# Patient Record
Sex: Female | Born: 1950 | Race: Black or African American | Hispanic: No | Marital: Single | Smoking: Never smoker
Health system: Southern US, Community
[De-identification: ages and names within clinical notes are randomized; demographics above are authoritative.]

## PROBLEM LIST (undated history)

## (undated) DIAGNOSIS — E119 Type 2 diabetes mellitus without complications: Secondary | ICD-10-CM

## (undated) DIAGNOSIS — I10 Essential (primary) hypertension: Secondary | ICD-10-CM

---

## 2003-08-21 ENCOUNTER — Emergency Department (HOSPITAL_COMMUNITY): Admission: EM | Admit: 2003-08-21 | Discharge: 2003-08-21 | Payer: Self-pay

## 2004-07-01 ENCOUNTER — Ambulatory Visit: Payer: Self-pay | Admitting: Physician Assistant

## 2004-12-16 ENCOUNTER — Inpatient Hospital Stay (HOSPITAL_COMMUNITY): Admission: RE | Admit: 2004-12-16 | Discharge: 2004-12-20 | Payer: Self-pay | Admitting: Orthopedic Surgery

## 2004-12-16 ENCOUNTER — Encounter (INDEPENDENT_AMBULATORY_CARE_PROVIDER_SITE_OTHER): Payer: Self-pay | Admitting: *Deleted

## 2006-06-14 ENCOUNTER — Other Ambulatory Visit: Admission: RE | Admit: 2006-06-14 | Discharge: 2006-06-14 | Payer: Self-pay | Admitting: Obstetrics and Gynecology

## 2006-07-02 ENCOUNTER — Encounter: Admission: RE | Admit: 2006-07-02 | Discharge: 2006-07-02 | Payer: Self-pay | Admitting: Obstetrics and Gynecology

## 2008-06-19 ENCOUNTER — Encounter: Admission: RE | Admit: 2008-06-19 | Discharge: 2008-09-03 | Payer: Self-pay | Admitting: Orthopaedic Surgery

## 2011-01-23 NOTE — Consult Note (Signed)
NAMEJACKQUELYN, SUNDBERG               ACCOUNT NO.:  [id number]   MEDICAL RECORD NO.:  [id number]          PATIENT TYPE:  INP   LOCATION:  5038                         FACILITY:  [facility]   PHYSICIAN:  Hollice Espy, M.D.DATE OF BIRTH:  1951-02-06   DATE OF CONSULTATION:  12/16/2004  DATE OF DISCHARGE:                                   CONSULTATION   REQUESTING PHYSICIAN:  Nelda Severe, M.D.   PRIMARY CARE PHYSICIAN:  Dr. Ferol Luz.   REASON FOR CONSULTATION:  Diabetes and hypertension management.   HISTORY OF PRESENT ILLNESS:  Patient is a 60 year old African-American  female with a past medical history of hypertension, diabetes, and  hyperlipidemia, who was admitted on December 16, 2004 for an L4-5 fusion with  screws.  Postop, her attending physician requested an [facility]  consult for diabetes and hypertensive management.  The patient is normally  on a number of medications.  In discussion with the patient's attending,  this type of surgery almost always leads to a temporary ileus, and the  patient would not fully on p.o. for some time.  It was recommended that the  patient's medication all be held, especially in terms of her diabetic  medications.  Currently, she states that she is doing okay.  She was having  some earlier back spasms, but these have settled down.  She denies any  headaches, visual changes, dysphagia, chest pain, palpitations, shortness of  breath, wheeze, or cough.  She denies any abdominal pain.  She does complain  of some back soreness.  She denies any lower extremity weakness, numbness,  hematuria, dysuria, constipation, or diarrhea.   Her review of systems is otherwise negative.   PAST MEDICAL HISTORY:  Includes hypertension, diabetes type 2,  hyperlipidemia.   MEDICATIONS:  1.  Hydrochlorothiazide 12.5 daily.  2.  Crestor 10 p.o. daily.  3.  Metformin 500 p.o. b.i.d.  4.  Actos 30 p.o. daily.  5.  Lisinopril 10 p.o. daily.   She has a drug  sensitivity to [facility].   SOCIAL HISTORY:  The patient denies any alcohol or drug use.  She also  denies any tobacco use.   FAMILY HISTORY:  Noncontributory.   PHYSICAL EXAMINATION:  VITALS ON ADMISSION:  Temp 96.8, heart rate 58,  respirations 16, blood pressure 122/84.  GENERAL:  Patient appears to be drowsy but easily arousable.  She is alert  and oriented x 3 in no apparent distress.  HEENT:  Normocephalic and atraumatic.  Mucous membranes are slightly dry.  NECK:  She has no carotid bruits.  LUNGS:  Clear to auscultation bilaterally.  HEART:  S1 and S2.  ABDOMEN:  Soft.  Nontender.  Nondistended with minimal bowel sounds.  EXTREMITIES:  No clubbing or cyanosis.  Trace pitting edema.   LAB WORK:  Preop labs show a white count of 9.3, H&H 11.3 and 34.  Platelet  count 255.  Sodium 140, potassium 3.6, chloride 108, bicarb 28, BUN 8,  creatinine 1, glucose 108, calcium 8.9.   ASSESSMENT/PLAN:  1.  Diabetes mellitus type 2:  Given the patient's surgery and the chances  for an ileus being high, would hold all diabetes mellitus meds until she      is taking p.o.  Cover with q.6h. sensitive sliding scale.  2.  Hypertension:  Would monitor patient for increases in blood pressure      secondary to pain but hold all of her p.o. meds for now.  In regards to      her hyperlipidemia, would hold her Crestor until she is able to take      p.o. on a regular basis.  We will continue to follow this patient.      SKK/MEDQ  D:  12/16/2004  T:  12/16/2004  Job:  [id number]   cc:   Nelda Severe, MD  Fax: [phone]

## 2011-01-23 NOTE — Op Note (Signed)
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   POSTPROCEDURE DIAGNOSES:  1.  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  2.  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   PROCEDURE:  Bilateral L4 laminectomy, inferior facetectomy, posterior  interbody fusion at L4-5 with Synthes PEEK interbody spacers, autogenous  graft and BMP; bilateral posterolateral fusion with autogenous graft and  BMP; spinal instrumentation, L4-5, with Synthes Click-X screws and rods;  harvest of local autogenous graft.   SURGEON:  Nelda Severe, MD   ASSISTANT:  Lynford Citizen, R.N.   PROCEDURE NOTE:  The patient was placed under general endotracheal  anesthesia.  Bilateral sequential compression devices were placed on the  lower extremities.  A Foley catheter was placed in the bladder.  The patient  was given 1 g of Ancef intravenously.   The patient was positioned prone on a four-poster frame ([employer]).  Care was  taken in positioning the shoulders to avoid hyperflexion and abduction of  the shoulders and hyperflexion of the elbows.  The arms were padded with  foam and there was no pressure on the cubital tunnels.  The lower  extremities were gently flexed at the hips and knees and carefully padded  with pillows throughout their length.   The lumbar area was prepped with DuraPrep and draped in rectangular fashion.  The drapes were secured with Ioban.   A midline incision was made over the lower lumbar area.  Subcutaneous tissue  and paraspinal fascia were injected with a mixture of 0.25% Marcaine with  epinephrine and 1% plain lidocaine.  Dissection was carried down onto the  spinous processes.  Paraspinal muscles were reflected and mobilized  bilaterally.  A crosstable lateral radiograph was taken with a Kocher on a  spinous process that turned out to be L3.  This conformed with my impression  of last mobile segment at L5-S1.   The transverse processes of L5 and of L4 were exposed bilaterally.   Pedicle holes were made at L5 and L4 bilaterally in the following fashion:  The base of the superior articular process was removed with a Leksell  rongeur.  The posterior pedicle was identified, perforated with an awl, and  a straight pedicle probe used to make a hole through the pedicle into the  vertebral body.  Each hole was carefully palpated circumferentially to make  sure that there were no breaches through the pedicle and sounded for depth  and the depths recorded.  Radiopaque markers were placed in each hole and  sealed with bone wax.  A crosstable lateral radiograph revealed satisfactory  position of markers.   We then began to harvest local bone graft.  This  was done by reaming down  the lamina and inferior articular process at L4 with an acetabular reamer to  yield morcellized graft.  The rest of the laminectomy and inferior  facetectomy was performed using osteotomes and rongeurs.  Once dura had been  exposed, cottonoid patties were interposed between the undersurface of the  lamina and the dura.  Epidural veins were coagulated with bipolar  coagulation in both neural foramina at L4-5 and the superior articular  processes of L5 removed bilaterally, or at least 90% removed.   We then identified the L5 nerve root on either side.  The soft tissue and  epidural veins which had been coagulated were dissected off of this  laterally to the nerve root.  The nerve root was then retracted and an  annulectomy performed.  This was done first on the left side and then on the  right.  The disk  space was prepared using special disk-scraping curettes  from the Synthes interbody spacer set and up to a 13 mm scraper was used.  Further curettage was carried out with spoon curettes and degenerated  nucleus material removed with a pituitary rongeur.   When the disk space had been adequately prepared on both sides, a 13 mm  spacer was loaded with bone graft.  A piece of collagen sponge soaked in BMP  was placed anteriorly in the disk space.  Morcellized bone graft was packed  in on top of this.  Then the 13 mm spacer was introduced and positioned.   On the opposite side, more graft was packed in anteriorly and then the  spacer introduced, already loaded with bone graft.  Both implants were  impacted anteriorly to the extent that they were well-countersunk.   We also then decorticated the transverse processes of L4 and L5.  A piece of  collagen sponge soaked in BMP was placed between the transverse processes at  L4 and L5.  A morcellized graft was packed on top of this on the right side  and on the left side another piece of collagen sponge placed on top of more  morcellized graft.   The Click-X system was then assembled and the rods placed.  The construct  was compressed to maximize lordosis and to lock the interbody implants in  place.  The couplings were torqued.   Crosstable lateral radiograph showed satisfactory position of screws and  interbody spacers.   Not mentioned here is that neurological monitoring was carried out  throughout the procedure.  At all times the SSEPs were satisfactory.  Prior  to attachment of the rods, each screw was stimulated and the threshold  values were in the safe zone.   Additionally, during insertion of the screws we palpated the pedicle  medially and distally with a nerve hook to make sure that there was no  perforation by screw threads.  Some FloSeal was used to control epidural bleeding, or actually bleeding  which was coming from the disk  space on the right side.  The wound was then  closed in layers as follows:  #1 Vicryl suture in continuous fashion was  used in the fascia.  Interrupted 2-0 Vicryl sutures were used in the  subcutaneous layer over a one-eight inch Hemovac drain.  The skin was closed  using a subcuticular 3-0 undyed Vicryl and Steri-Strips.  The eighth-inch  Hemovac drain was secured with a 2-0 nylon suture.   An antibiotic ointment dressing was then attached after reinforcement of the  skin  edges with Steri-Strips and secured with OpSite.   Estimated blood loss 150 mL.  At the time of dictation, the patient is being  readied to be taken off the table, so she is asleep and no neurologic  examination is reported here.  There were no intraoperative complications.  Sponge and needle counts were correct.      MT/MEDQ  D:  12/16/2004  T:  12/16/2004  Job:  [id number]

## 2011-01-23 NOTE — Discharge Summary (Signed)
Abigail Fry, SPOHR NO.:  [id number]   MEDICAL RECORD NO.:  [id number]          PATIENT TYPE:  INP   LOCATION:  5038                         FACILITY:  [facility]   PHYSICIAN:  Nelda Severe, MD      DATE OF BIRTH:  11-23-1950   DATE OF ADMISSION:  12/16/2004  DATE OF DISCHARGE:  12/20/2004                                 DISCHARGE SUMMARY   This woman was admitted for management of lumbar pain secondary to L4-5 disk  disease.  On the day of admission, she was taken to the operating room where  posterior interbody and posterolateral fusion was carried out at L4-5.  Postoperatively, her care was comanaged with the [facility].  She is a type 2 diabetic.  She spiked a fever to approximately 103 degrees  Fahrenheit, but no serious etiology was ever defined.  Her ileus resolved  around the third postoperative day.  Her incision is healing well with no  drainage and no erythema.  She is ambulatory with a walker.  She is  afebrile.  Her pain is controlled on oral Percocet.   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   CONDITION ON DISCHARGE:  Improved back pain.  Ambulatory with walker.  Would  stable.   DISCHARGE MEDICATIONS:  1.  Percocet.  A prescription dated today, December 20, 2004, for 75 tablets,      one to two q.6h. p.r.n. and a second prescription if necessary dated      December 30, 2004, for 50 Percocet tablets one q.6h. p.r.n.  2.  She was also give a prescription for Colace 100 mg b.i.d. for three      weeks to be repeated once if necessary.   FOLLOWUP:  She is to follow up in the office in four weeks' time.   ACTIVITY:  She is to avoid bending and lifting.   SPECIAL INSTRUCTIONS:  She is to call me if there is any wound drainage or  other problem.      MT/MEDQ  D:  12/20/2004  T:  12/20/2004  Job:  [id number]

## 2014-07-25 ENCOUNTER — Ambulatory Visit: Payer: 59 | Attending: Physical Therapy | Admitting: Physical Therapy

## 2014-07-25 DIAGNOSIS — I10 Essential (primary) hypertension: Secondary | ICD-10-CM | POA: Insufficient documentation

## 2014-07-25 DIAGNOSIS — E119 Type 2 diabetes mellitus without complications: Secondary | ICD-10-CM | POA: Insufficient documentation

## 2014-07-25 DIAGNOSIS — Z981 Arthrodesis status: Secondary | ICD-10-CM | POA: Insufficient documentation

## 2014-07-25 DIAGNOSIS — M1990 Unspecified osteoarthritis, unspecified site: Secondary | ICD-10-CM | POA: Insufficient documentation

## 2014-07-25 DIAGNOSIS — M25559 Pain in unspecified hip: Secondary | ICD-10-CM | POA: Insufficient documentation

## 2014-07-25 DIAGNOSIS — M79673 Pain in unspecified foot: Secondary | ICD-10-CM | POA: Insufficient documentation

## 2014-07-25 DIAGNOSIS — M25562 Pain in left knee: Secondary | ICD-10-CM | POA: Insufficient documentation

## 2014-07-25 DIAGNOSIS — M25561 Pain in right knee: Secondary | ICD-10-CM | POA: Insufficient documentation

## 2014-07-25 DIAGNOSIS — Z5189 Encounter for other specified aftercare: Secondary | ICD-10-CM | POA: Insufficient documentation

## 2014-07-30 ENCOUNTER — Ambulatory Visit: Payer: 59

## 2014-07-30 DIAGNOSIS — Z5189 Encounter for other specified aftercare: Secondary | ICD-10-CM | POA: Diagnosis not present

## 2014-08-16 ENCOUNTER — Ambulatory Visit: Payer: 59 | Admitting: Physical Therapy

## 2014-08-30 ENCOUNTER — Ambulatory Visit: Payer: 59 | Admitting: Physical Therapy

## 2014-08-30 ENCOUNTER — Ambulatory Visit: Payer: 59 | Attending: Internal Medicine | Admitting: Physical Therapy

## 2014-08-30 DIAGNOSIS — Z981 Arthrodesis status: Secondary | ICD-10-CM | POA: Insufficient documentation

## 2014-08-30 DIAGNOSIS — M25559 Pain in unspecified hip: Secondary | ICD-10-CM | POA: Diagnosis not present

## 2014-08-30 DIAGNOSIS — M79673 Pain in unspecified foot: Secondary | ICD-10-CM | POA: Insufficient documentation

## 2014-08-30 DIAGNOSIS — M1990 Unspecified osteoarthritis, unspecified site: Secondary | ICD-10-CM | POA: Insufficient documentation

## 2014-08-30 DIAGNOSIS — E119 Type 2 diabetes mellitus without complications: Secondary | ICD-10-CM | POA: Diagnosis not present

## 2014-08-30 DIAGNOSIS — Z5189 Encounter for other specified aftercare: Secondary | ICD-10-CM | POA: Insufficient documentation

## 2014-08-30 DIAGNOSIS — I10 Essential (primary) hypertension: Secondary | ICD-10-CM | POA: Insufficient documentation

## 2014-08-30 DIAGNOSIS — M25561 Pain in right knee: Secondary | ICD-10-CM | POA: Diagnosis not present

## 2014-08-30 DIAGNOSIS — M25562 Pain in left knee: Secondary | ICD-10-CM | POA: Diagnosis not present

## 2017-05-13 ENCOUNTER — Emergency Department (HOSPITAL_COMMUNITY): Payer: 59

## 2017-05-13 ENCOUNTER — Encounter (HOSPITAL_COMMUNITY): Payer: Self-pay | Admitting: *Deleted

## 2017-05-13 ENCOUNTER — Emergency Department (HOSPITAL_COMMUNITY)
Admission: EM | Admit: 2017-05-13 | Discharge: 2017-05-13 | Disposition: A | Discharge status: Home / Self Care | Payer: 59 | Attending: Emergency Medicine | Admitting: Emergency Medicine

## 2017-05-13 DIAGNOSIS — E119 Type 2 diabetes mellitus without complications: Secondary | ICD-10-CM | POA: Insufficient documentation

## 2017-05-13 DIAGNOSIS — N200 Calculus of kidney: Secondary | ICD-10-CM

## 2017-05-13 DIAGNOSIS — Z794 Long term (current) use of insulin: Secondary | ICD-10-CM | POA: Diagnosis not present

## 2017-05-13 DIAGNOSIS — R1031 Right lower quadrant pain: Secondary | ICD-10-CM | POA: Diagnosis present

## 2017-05-13 DIAGNOSIS — N23 Unspecified renal colic: Secondary | ICD-10-CM

## 2017-05-13 DIAGNOSIS — R109 Unspecified abdominal pain: Secondary | ICD-10-CM

## 2017-05-13 DIAGNOSIS — Z79899 Other long term (current) drug therapy: Secondary | ICD-10-CM | POA: Insufficient documentation

## 2017-05-13 DIAGNOSIS — I10 Essential (primary) hypertension: Secondary | ICD-10-CM | POA: Insufficient documentation

## 2017-05-13 HISTORY — DX: Type 2 diabetes mellitus without complications: E11.9

## 2017-05-13 HISTORY — DX: Essential (primary) hypertension: I10

## 2017-05-13 LAB — URINALYSIS, ROUTINE W REFLEX MICROSCOPIC
Bilirubin Urine: NEGATIVE
Glucose, UA: NEGATIVE mg/dL
Ketones, ur: NEGATIVE mg/dL
Leukocytes, UA: NEGATIVE
Nitrite: NEGATIVE
Protein, ur: NEGATIVE mg/dL
Specific Gravity, Urine: 1.019 (ref 1.005–1.030)
pH: 5 (ref 5.0–8.0)

## 2017-05-13 LAB — COMPREHENSIVE METABOLIC PANEL
ALT: 55 U/L — ABNORMAL HIGH (ref 14–54)
AST: 55 U/L — ABNORMAL HIGH (ref 15–41)
Albumin: 4.1 g/dL (ref 3.5–5.0)
Alkaline Phosphatase: 79 U/L (ref 38–126)
Anion gap: 9 (ref 5–15)
BUN: 14 mg/dL (ref 6–20)
CO2: 24 mmol/L (ref 22–32)
Calcium: 9.6 mg/dL (ref 8.9–10.3)
Chloride: 104 mmol/L (ref 101–111)
Creatinine, Ser: 0.95 mg/dL (ref 0.44–1.00)
GFR calc Af Amer: >60 mL/min (ref >60)
GFR calc non Af Amer: >60 mL/min (ref >60)
Glucose, Bld: 124 mg/dL — ABNORMAL HIGH (ref 65–99)
Potassium: 4.7 mmol/L (ref 3.5–5.1)
Sodium: 137 mmol/L (ref 135–145)
Total Bilirubin: 0.8 mg/dL (ref 0.3–1.2)
Total Protein: 7.3 g/dL (ref 6.5–8.1)

## 2017-05-13 LAB — CBC
HCT: 40.4 % (ref 36.0–46.0)
Hemoglobin: 13.2 g/dL (ref 12.0–15.0)
MCH: 28.1 pg (ref 26.0–34.0)
MCHC: 32.7 g/dL (ref 30.0–36.0)
MCV: 86.1 fL (ref 78.0–100.0)
Platelets: 248 10*3/uL (ref 150–400)
RBC: 4.69 MIL/uL (ref 3.87–5.11)
RDW: 14.4 % (ref 11.5–15.5)
WBC: 9.9 10*3/uL (ref 4.0–10.5)

## 2017-05-13 LAB — LIPASE, BLOOD: Lipase: 31 U/L (ref 11–51)

## 2017-05-13 MED ORDER — HYDROCODONE-ACETAMINOPHEN 5-325 MG PO TABS: 1.0000 | ORAL_TABLET | Freq: Four times a day (QID) | ORAL | 0 refills | Status: DC | PRN

## 2017-05-13 MED ORDER — HYDROCODONE-ACETAMINOPHEN 5-325 MG PO TABS: 1.0000 | ORAL_TABLET | Freq: Four times a day (QID) | ORAL | 0 refills | Status: AC | PRN

## 2017-05-13 MED ORDER — KETOROLAC TROMETHAMINE 15 MG/ML IJ SOLN
15.0000 mg | Freq: Once | INTRAMUSCULAR | Status: AC
  Administered 2017-05-13: 15 mg via INTRAVENOUS
  Filled 2017-05-13: qty 1

## 2017-05-13 MED ORDER — LACTATED RINGERS IV BOLUS (SEPSIS)
1000.0000 mL | Freq: Once | INTRAVENOUS | Status: AC
Start: 2017-05-13 — End: 2017-05-13
  Administered 2017-05-13: 1000 mL via INTRAVENOUS

## 2017-05-13 MED ORDER — HYDROCODONE-ACETAMINOPHEN 5-325 MG PO TABS
1.0000 | ORAL_TABLET | Freq: Once | ORAL | Status: AC
  Administered 2017-05-13: 1 via ORAL
  Filled 2017-05-13: qty 1

## 2017-05-13 MED ORDER — ONDANSETRON HCL 4 MG/2ML IJ SOLN
4.0000 mg | Freq: Once | INTRAMUSCULAR | Status: AC
  Administered 2017-05-13: 4 mg via INTRAVENOUS
  Filled 2017-05-13: qty 2

## 2017-05-13 NOTE — ED Notes (Signed)
Pt stable, ambulatory, states understanding of discharge instructions 

## 2017-05-13 NOTE — ED Notes (Signed)
Family at bedside. PT actively at this time

## 2017-05-13 NOTE — ED Triage Notes (Signed)
The pt arrived by gems from home.  C/o severe pain rt flank with pressure for 45 minutes peior to arrival  Nausea no vomiting yet  cbg 84  No iv one attecmpt  unsucessful by ems  No bloody urine no urinary symptoms pain with movement

## 2017-05-13 NOTE — ED Provider Notes (Signed)
[facility]EMERGENCY DEPT Provider Note   CSN: [id number] Arrival date & time: 05/13/17  1951  History   Chief Complaint Chief Complaint  Patient presents with  . Flank Pain    HPI Abigail Fry is a 66 y.o. female.  This is a 66 year old female with PMH of HTN, T2 DM who presents with acute onset of right flank pain that radiates to her groin approximately 3 hours prior when picking up her child from daycare.  Patient states she has intense nausea but no vomiting, dysuria, hesitancy, feelings of straining when she urinates.  She denies any dyspnea, chest pain, change in bowel movements.  She denies any numbness or tingling in her extremities.  Denies any personal or family history of kidney stones.   The history is provided by the patient.    Past Medical History:  Diagnosis Date  . Diabetes mellitus without complication (HCC)   . Hypertension     There are no active problems to display for this patient.   No past surgical history on file.  OB History    No data available       Home Medications    Prior to Admission medications   Medication Sig Start Date End Date Taking? Authorizing Provider  atorvastatin (LIPITOR) 20 MG tablet Take 20 mg by mouth daily. 04/08/17  Yes [provider]  cetirizine (ZYRTEC) 10 MG tablet Take 10 mg by mouth daily. 07/03/13  Yes [provider]  fluticasone (FLONASE) 50 MCG/ACT nasal spray Place 1 spray into both nostrils as needed for allergies. 02/06/15  Yes [provider]  gabapentin (NEURONTIN) 300 MG capsule Take 300 mg by mouth 2 (two) times daily. 10/08/15  Yes [provider]  glimepiride (AMARYL) 1 MG tablet Take 1 mg by mouth daily. 03/15/17  Yes [provider]  Insulin Glargine (BASAGLAR KWIKPEN) 100 UNIT/ML SOPN Inject 20 Units into the skin at bedtime. 05/12/17  Yes [provider]  losartan-hydrochlorothiazide (HYZAAR) 50-12.5 MG tablet Take 1 tablet by mouth daily. 04/14/17  Yes  [provider]  metFORMIN (GLUCOPHAGE) 1000 MG tablet Take 500 mg by mouth 2 (two) times daily. 04/14/16  Yes [provider]  potassium chloride SA (K-DUR,KLOR-CON) 20 MEQ tablet Take 20 mEq by mouth 2 (two) times daily. 09/07/16 09/07/17 Yes [provider]  HYDROcodone-acetaminophen (NORCO/VICODIN) 5-325 MG tablet Take 1 tablet by mouth every 6 (six) hours as needed for moderate pain. 05/13/17 05/14/17  Shaune PollackBriggs, Carmeline Kowal, MD    Family History No family history on file.  Social History Social History  Substance Use Topics  . Smoking status: Never Smoker  . Smokeless tobacco: Never Used  . Alcohol use No     Allergies   Ace inhibitors   Review of Systems Review of Systems  Constitutional: Negative for chills and fever.  HENT: Negative for ear pain and sore throat.   Eyes: Negative for pain and visual disturbance.  Respiratory: Negative for cough and shortness of breath.   Cardiovascular: Negative for chest pain and palpitations.  Gastrointestinal: Negative for abdominal pain and vomiting.  Genitourinary: Negative for dysuria and hematuria.  Musculoskeletal: Negative for arthralgias and back pain.  Skin: Negative for color change and rash.  Neurological: Negative for seizures and syncope.  All other systems reviewed and are negative.    Physical Exam Updated Vital Signs BP 126/60   Pulse (!) 55   Temp 97.6 F (36.4 C) (Oral)   Resp 18   Ht 5' 6.5" (1.689 m)  Wt 95.3 kg (210 lb)   SpO2 100%   BMI 33.39 kg/m   Physical Exam  Constitutional: She appears well-developed and well-nourished. No distress.  HENT:  Head: Normocephalic and atraumatic.  Eyes: Conjunctivae are normal.  Neck: Neck supple.  Cardiovascular: Normal rate and regular rhythm.   No murmur heard. Pulmonary/Chest: Effort normal and breath sounds normal. No respiratory distress.  Abdominal: Soft. There is tenderness in the suprapubic area. There is CVA tenderness. There is no  rigidity and no guarding.  Right sided CVA tenderness.  Musculoskeletal: She exhibits no edema.  Neurological: She is alert.  Skin: Skin is warm and dry.  Psychiatric: She has a normal mood and affect.  Nursing note and vitals reviewed.    ED Treatments / Results  Labs (all labs ordered are listed, but only abnormal results are displayed) Labs Reviewed  COMPREHENSIVE METABOLIC PANEL - Abnormal; Notable for the following:       Result Value   Glucose, Bld 124 (*)    AST 55 (*)    ALT 55 (*)    All other components within normal limits  URINALYSIS, ROUTINE W REFLEX MICROSCOPIC - Abnormal; Notable for the following:    APPearance HAZY (*)    Hgb urine dipstick SMALL (*)    Bacteria, UA RARE (*)    Squamous Epithelial / LPF 0-5 (*)    All other components within normal limits  LIPASE, BLOOD  CBC    EKG  EKG Interpretation None       Radiology Ct Abdomen Pelvis Wo Contrast  Result Date: 05/13/2017 CLINICAL DATA:  Severe right flank pain. EXAM: CT ABDOMEN AND PELVIS WITHOUT CONTRAST TECHNIQUE: Multidetector CT imaging of the abdomen and pelvis was performed following the standard protocol without IV contrast. COMPARISON:  None. FINDINGS: Lower chest: No acute abnormality. Hepatobiliary: No focal liver abnormality is seen. No gallstones, gallbladder wall thickening, or biliary dilatation. Pancreas: Unremarkable. No pancreatic ductal dilatation or surrounding inflammatory changes. Spleen: Normal in size without focal abnormality. Adrenals/Urinary Tract: Right-sided hydronephrosis and perinephric stranding is identified. There is also right ureterectasis extending into the pelvis. There is a 4 mm stone in the right side of the bladder. No ureteral stones noted. The kidneys, ureters, and bladder are otherwise normal. The adrenal glands are normal. Stomach/Bowel: There is a small hiatal hernia. The stomach and small bowel are normal. The colon is normal. The appendix is not visualized but  there is no secondary evidence of appendicitis. Vascular/Lymphatic: No significant vascular findings are present. No enlarged abdominal or pelvic lymph nodes. Reproductive: Status post hysterectomy. No adnexal masses. Other: No free air or free fluid. Musculoskeletal: Postsurgical changes in the spine. IMPRESSION: 1. There is a 4 mm stone in the right side of the bladder consistent with a recently passed stone. Mild right hydronephrosis, perinephric stranding, and moderate right ureterectasis persist, consistent with the recently passed stone. Electronically Signed   By: Gerome Sam III M.D   On: 05/13/2017 21:30    Procedures Procedures (including critical care time)  Medications Ordered in ED Medications  ketorolac (TORADOL) 15 MG/ML injection 15 mg (15 mg Intravenous Given 05/13/17 2040)  ondansetron (ZOFRAN) injection 4 mg (4 mg Intravenous Given 05/13/17 2039)  lactated ringers bolus 1,000 mL (0 mLs Intravenous Stopped 05/13/17 2211)  HYDROcodone-acetaminophen (NORCO/VICODIN) 5-325 MG per tablet 1 tablet (1 tablet Oral Given 05/13/17 2211)     Initial Impression / Assessment and Plan / ED Course  I have reviewed the triage vital signs  and the nursing notes.  Pertinent labs & imaging results that were available during my care of the patient were reviewed by me and considered in my medical decision making (see chart for details).     This is a 66 year old female with PMH of HTN, T2 DM who presents with acute onset of right flank pain that radiates to her groin approximately 3 hours prior when picking up her child from daycare.   Patient has CVA tenderness on exam radiating to the suprapubic area on her right side.  Patient Has classic presentation for colicky abdominal pain consistent with concern for nephrolithiasis.  Basic blood work ordered including CBC, BMP, urine studies. Urine studies show no sign of acute infection. CT abdomen pelvis without contrast ordered for stone  study.  Patient given Zofran, Toradol, IV fluids. Patient subsequently given Norco for pain control and tolerated PO intake.  CT scan demonstrates bladder stone with mild right hydronephrosis.  Stone measures 4 mm.  At this time patient cleared for outpatient follow-up with pain control and instructions on urine straining.  Return precautions given. All questions answered. Patient given Rx for Norco for 2 days. [place] controlled substance database was used prior to prescription writing.  Final Clinical Impressions(s) / ED Diagnoses   Final diagnoses:  Right flank pain  Nephrolithiasis  Ureteral colic    New Prescriptions New Prescriptions   HYDROCODONE-ACETAMINOPHEN (NORCO/VICODIN) 5-325 MG TABLET    Take 1 tablet by mouth every 6 (six) hours as needed for moderate pain.     Shaune Pollack, MD 05/13/17 2300    Nira Conn, MD 05/14/17 [phone]

## 2017-05-13 NOTE — ED Notes (Signed)
MD Eliot FordBriggs gave pt prescription and work note left out of original discharge papers.

## 2017-05-13 NOTE — ED Notes (Signed)
Pt ambulated to restroom with standby assistance. Steady gait noted and no additional assistance needed.

## 2019-01-01 IMAGING — CT CT ABD-PELV W/O CM
2 of 4 series · 16 of 46 positions shown, 18 images · non-contrast
Comparison: None.

CLINICAL DATA: Severe right flank pain.

EXAM:
CT ABDOMEN AND PELVIS WITHOUT CONTRAST
TECHNIQUE: Multidetector CT imaging of the abdomen and pelvis was performed
following the standard protocol without IV contrast.

[Series 3: ap without · axial · non-contrast · 0.89mm/px · z∈[+761,+1186]mm · 13 of 96 slices shown, 15 images]
[im 6/96  soft-tissue]
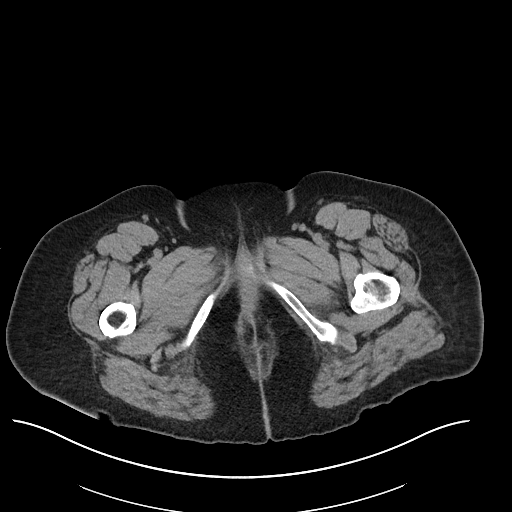
[im 6/96  bone]
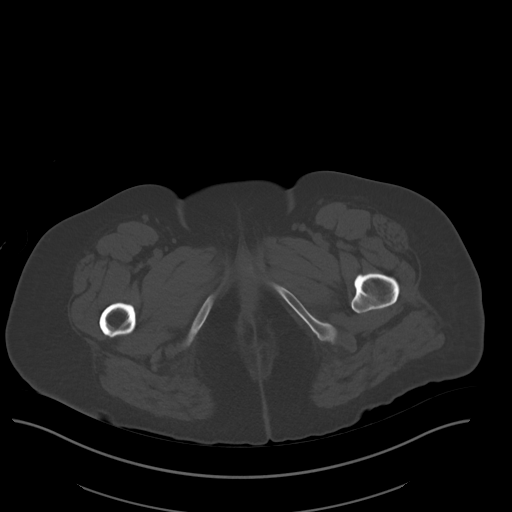
[im 16/96  soft-tissue]
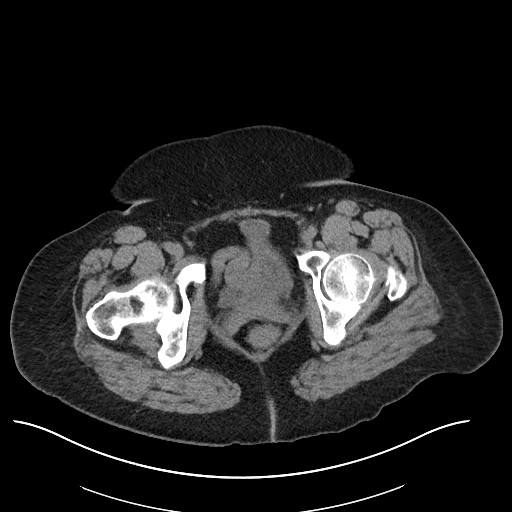
[im 21/96  soft-tissue]
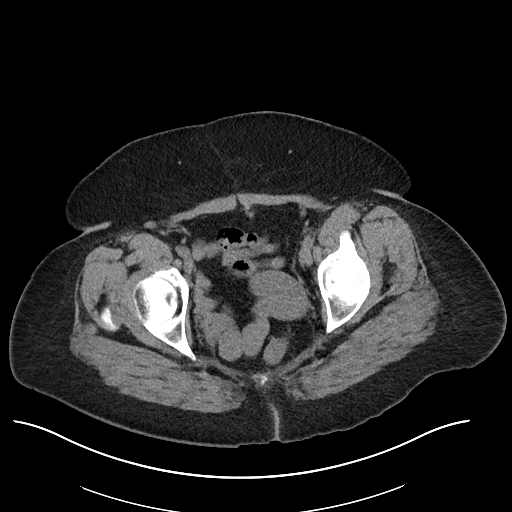
[im 26/96  soft-tissue]
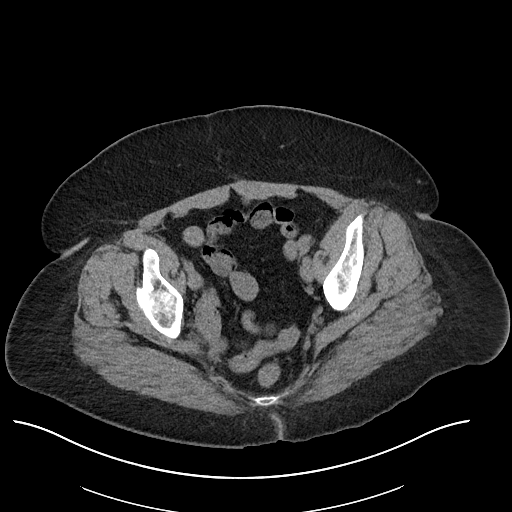
[im 36/96  soft-tissue]
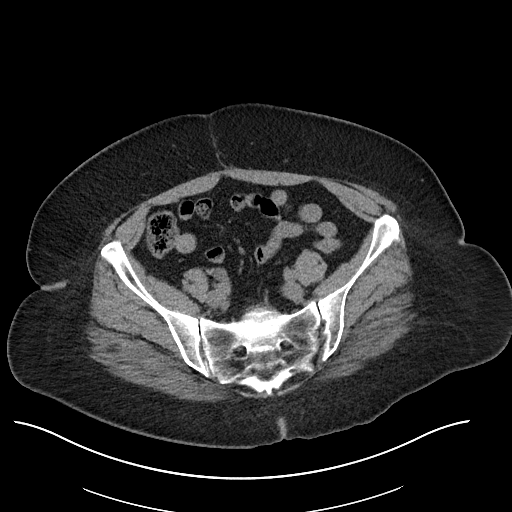
[im 41/96  soft-tissue]
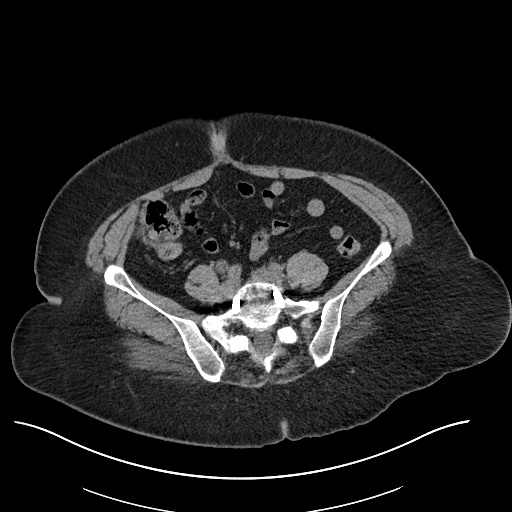
[im 51/96  soft-tissue]
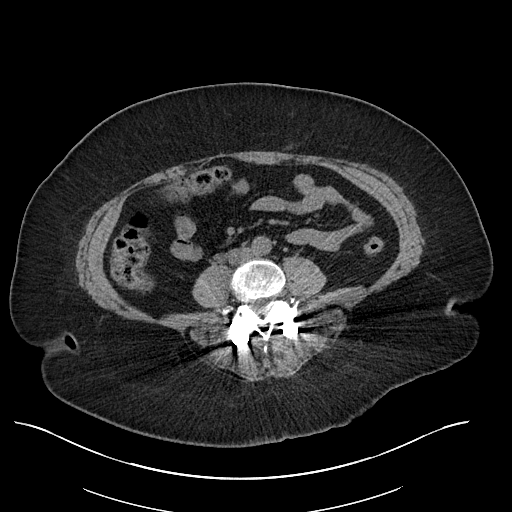
[im 56/96  soft-tissue]
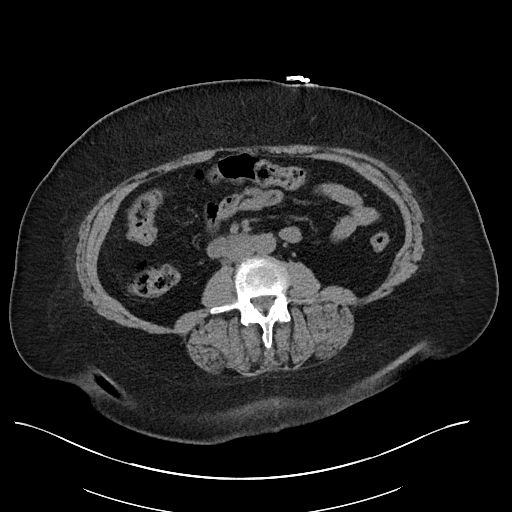
[im 61/96  soft-tissue]
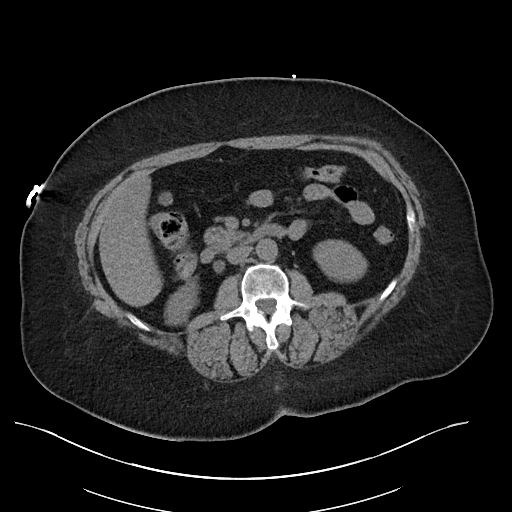
[im 61/96  bone]
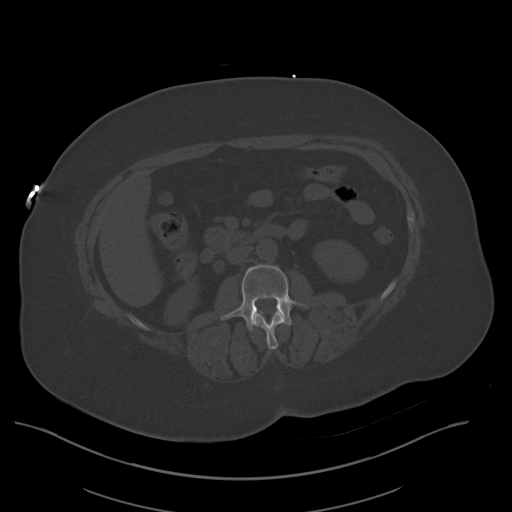
[im 71/96  soft-tissue]
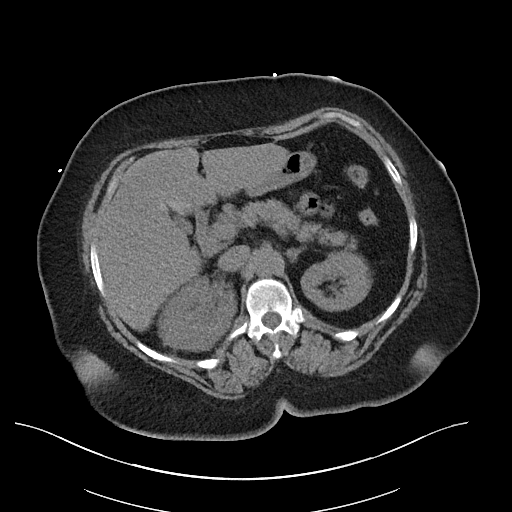
[im 76/96  soft-tissue]
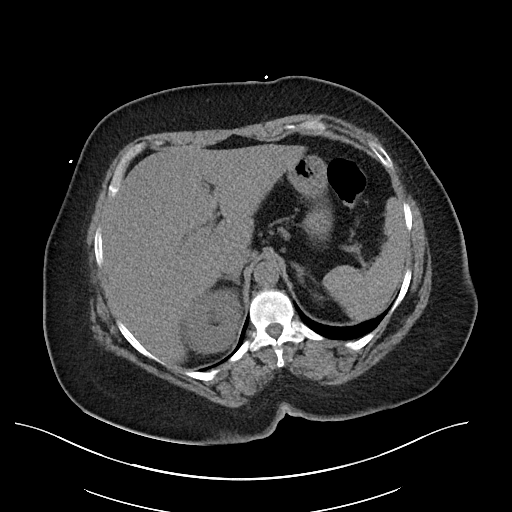
[im 81/96  soft-tissue]
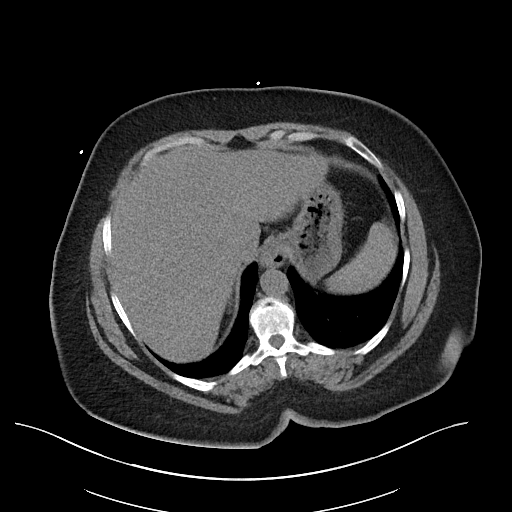
[im 91/96  soft-tissue]
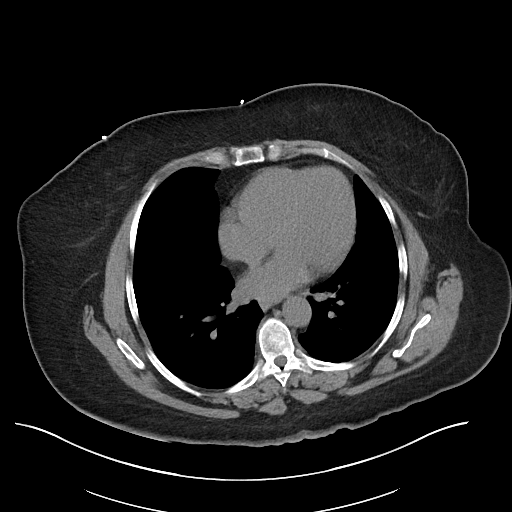

[Series 6: cor · coronal · 0.80mm/px · 3 of 90 slices shown]
[im 30/90  soft-tissue]
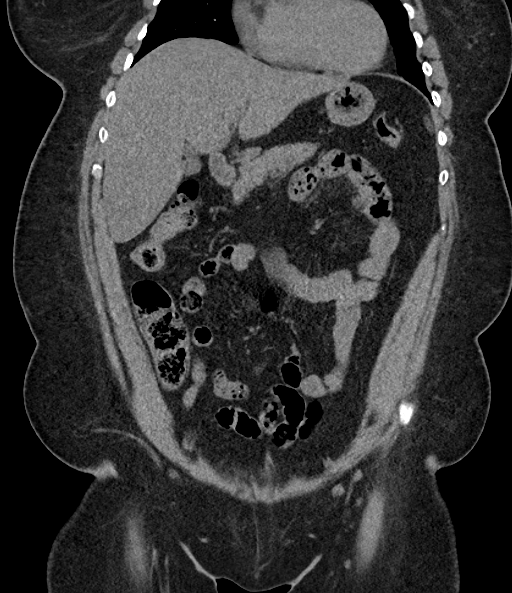
[im 40/90  soft-tissue]
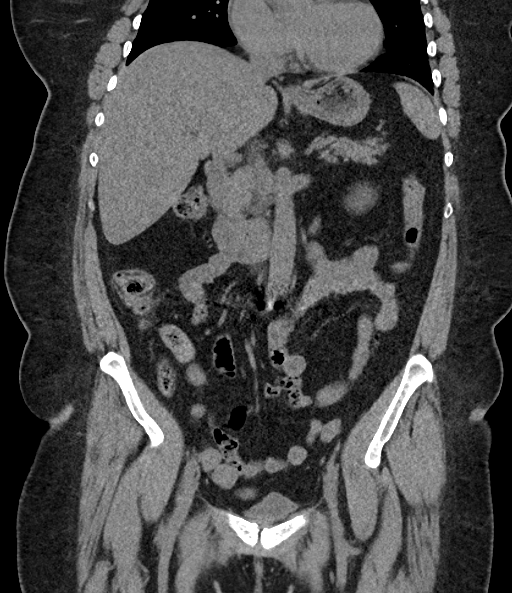
[im 50/90  soft-tissue]
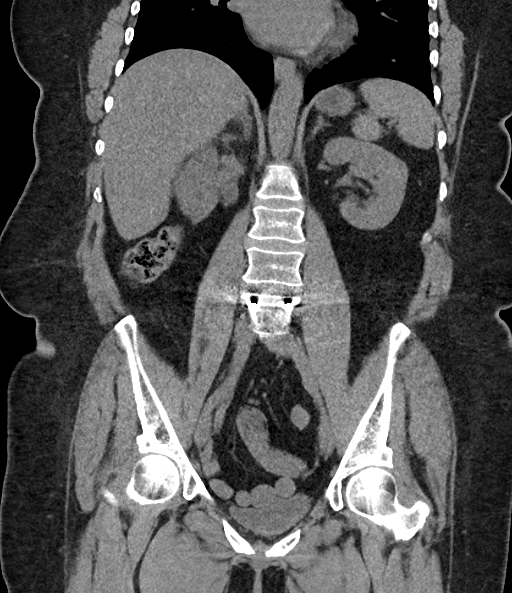

[16 of 46 positions shown; findings below may reference images not displayed]

FINDINGS: Lower chest: No acute abnormality.

Hepatobiliary: No focal liver abnormality is seen. No gallstones,
gallbladder wall thickening, or biliary dilatation.

Pancreas: Unremarkable. No pancreatic ductal dilatation or
surrounding inflammatory changes.

Spleen: Normal in size without focal abnormality.

Adrenals/Urinary Tract: Right-sided hydronephrosis and perinephric
stranding is identified. There is also right ureterectasis extending
into the pelvis. There is a 4 mm stone in the right side of the
bladder. No ureteral stones noted. The kidneys, ureters, and bladder
are otherwise normal. The adrenal glands are normal.

Stomach/Bowel: There is a small hiatal hernia. The stomach and small
bowel are normal. The colon is normal. The appendix is not
visualized but there is no secondary evidence of appendicitis.

Vascular/Lymphatic: No significant vascular findings are present. No
enlarged abdominal or pelvic lymph nodes.

Reproductive: Status post hysterectomy. No adnexal masses.

Other: No free air or free fluid.

Musculoskeletal: Postsurgical changes in the spine.
IMPRESSION: 1. There is a 4 mm stone in the right side of the bladder consistent
with a recently passed stone. Mild right hydronephrosis, perinephric
stranding, and moderate right ureterectasis persist, consistent with
the recently passed stone.
